# Patient Record
Sex: Male | Born: 2005 | Hispanic: Yes | Marital: Single | Smoking: Never smoker
Health system: Southern US, Community
[De-identification: ages and names within clinical notes are randomized; demographics above are authoritative.]

---
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
## 2024-03-05 ENCOUNTER — Ambulatory Visit: Admitting: Dermatology
